# Patient Record
Sex: Male | Born: 2000 | Race: Black or African American | Hispanic: No | Marital: Single | Smoking: Never smoker
Health system: Southern US, Community
[De-identification: ages and names within clinical notes are randomized; demographics above are authoritative.]

## PROBLEM LIST (undated history)

## (undated) DIAGNOSIS — J45909 Unspecified asthma, uncomplicated: Secondary | ICD-10-CM

## (undated) DIAGNOSIS — E785 Hyperlipidemia, unspecified: Secondary | ICD-10-CM

## (undated) DIAGNOSIS — E669 Obesity, unspecified: Secondary | ICD-10-CM

## (undated) HISTORY — DX: Unspecified asthma, uncomplicated: J45.909

## (undated) HISTORY — PX: TONSILLECTOMY AND ADENOIDECTOMY: SUR1326

## (undated) HISTORY — DX: Obesity, unspecified: E66.9

## (undated) HISTORY — DX: Hyperlipidemia, unspecified: E78.5

---
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
## 2021-02-17 ENCOUNTER — Ambulatory Visit (INDEPENDENT_AMBULATORY_CARE_PROVIDER_SITE_OTHER): Payer: No Typology Code available for payment source | Admitting: Pediatric Endocrinology

## 2021-05-06 ENCOUNTER — Ambulatory Visit (INDEPENDENT_AMBULATORY_CARE_PROVIDER_SITE_OTHER): Payer: PRIVATE HEALTH INSURANCE | Admitting: Pediatric Endocrinology

## 2021-05-06 ENCOUNTER — Other Ambulatory Visit: Payer: Self-pay

## 2021-05-06 ENCOUNTER — Encounter (INDEPENDENT_AMBULATORY_CARE_PROVIDER_SITE_OTHER): Payer: Self-pay | Admitting: Pediatric Endocrinology

## 2021-05-06 VITALS — BP 130/90 | HR 84 | Wt 372.0 lb

## 2021-05-06 DIAGNOSIS — E8881 Metabolic syndrome: Secondary | ICD-10-CM

## 2021-05-06 DIAGNOSIS — L83 Acanthosis nigricans: Secondary | ICD-10-CM

## 2021-05-06 DIAGNOSIS — E88819 Insulin resistance, unspecified: Secondary | ICD-10-CM | POA: Insufficient documentation

## 2021-05-06 DIAGNOSIS — I10 Essential (primary) hypertension: Secondary | ICD-10-CM

## 2021-05-06 DIAGNOSIS — Z68.41 Body mass index (BMI) pediatric, greater than or equal to 95th percentile for age: Secondary | ICD-10-CM

## 2021-05-06 MED ORDER — LISINOPRIL 10 MG PO TABS: 10.0000 mg | ORAL_TABLET | Freq: Every day | ORAL | 3 refills | Status: AC

## 2021-05-06 MED ORDER — WEGOVY 1 MG/0.5ML ~~LOC~~ SOAJ: 1.0000 mg | SUBCUTANEOUS | 5 refills | Status: DC

## 2021-05-06 NOTE — Progress Notes (Signed)
Subjective:  Subjective  Patient Name: Jeffrey Beard Date of Birth: [birth year]  MRN: [id number]  Jeffrey Beard  Presents to clinic today for follow up evaluation and management of his morbid obesity, acanthosis, elevated hemoglobin a1c, and elevated LDL  HISTORY OF PRESENT ILLNESS:   Jeffrey Beard is a 21 y.o. AA male   Jeffrey Beard was accompanied by his mother   1. Jeffrey Beard was seen by his PCP in January 2020 for his 18 year WCC. At that visit they discussed ongoing concerns and obtained routine labs. His labs showed that his hemoglobin A1C was elevated to 5.8%. He was also noted to have LDL of 155 mg/dL and a TC of 250 mg/dL. He was referred to endocrine for further evaluation and management.   Of note- Jeffrey Beard was previously seen in 2013 in pediatric endocrine clinic and was subsequently lost to follow up.   2. Jeffrey Beard was last seen in pediatric endocrine clinic on 11/04/20  He is working at [employer]. He is on his feet a lot. His has noticed that his feet hurt and it has been harder for him to exercise. He has a hard time walking to the gym to lift weights.   He has decreased several pant sizes. His 4X shirts are starting to be baggy on him.   He has continued on Ozempic 1 mg. Mom says that he needs a new authorization and since he does not have diabetes their insurance wants him on Wegovi instead.   He is having a harder time eating on a schedule. Mom does not feel that he is eating enough.  _______________________ (Skipped in clinic on 8/24) He was able to do 200 Jeffrey Beard today in clinic.   100 -> 150 -> 200   He still has not tried his 3X hoodie.    3. Pertinent Review of Systems:  Constitutional: The patient feels "oka". The patient seems healthy and active. Eyes: Vision seems to be good. There are no recognized eye problems. Wears glasses. Has "alternating XT" - needs strabismus surgery but worried about weight and anaesthesia.  Neck: The patient has no complaints of anterior neck  swelling, soreness, tenderness, pressure, discomfort, or difficulty swallowing.   Heart: Heart rate increases with exercise or other physical activity. The patient has no complaints of palpitations, irregular heart beats, chest pain, or chest pressure.   Lungs: Asthma- no recent attacks. Snoring has improved.  Gastrointestinal: Bowel movents seem normal. The patient has no complaints of acid reflux, upset stomach, stomach aches or pains, diarrhea, or constipation. Legs: Muscle mass and strength seem normal. There are no complaints of numbness, tingling, burning, or pain. No edema is noted.  Feet: There are no obvious foot problems. There are no complaints of numbness, tingling, burning, or pain. No edema is noted. Neurologic: There are no recognized problems with muscle movement and strength, sensation, or coordination. GYN/GU: no issues. Denies polyuria.   PAST MEDICAL, FAMILY, AND SOCIAL HISTORY  Past Medical History:  Diagnosis Date   Asthma    Hyperlipidemia    Obesity     Family History  Problem Relation Age of Onset   Obesity Mother    Obesity Father    Hyperlipidemia Maternal Grandmother    Diabetes Paternal Grandmother      Current Outpatient Medications:    lisinopril (ZESTRIL) 10 MG tablet, Take 1 tablet (10 mg total) by mouth daily., Disp: 90 tablet, Rfl: 3   Semaglutide-Weight Management (WEGOVY) 1 MG/0.5ML SOAJ, Inject 1 mg into the skin once  a week., Disp: 2 mL, Rfl: 5   fluticasone (FLONASE) 50 MCG/ACT nasal spray, Place into the nose. (Patient not taking: Reported on 04/29/2020), Disp: , Rfl:    lansoprazole (PREVACID) 15 MG capsule, Take 1 capsule (15 mg total) by mouth daily. (Patient not taking: Reported on 05/06/2021), Disp: 30 capsule, Rfl: 6   montelukast (SINGULAIR) 5 MG chewable tablet, Chew 5 mg by mouth at bedtime. (Patient not taking: Reported on 04/29/2020), Disp: , Rfl:   Allergies as of 05/06/2021   (No Known Allergies)     reports that he has never  smoked. He has been exposed to tobacco smoke. He has never used smokeless tobacco. He reports that he does not drink alcohol and does not use drugs. Pediatric History  Patient Parents   Merant,Lorraine (Mother)   Other Topics Concern   Not on file  Social History Narrative   Currently looking for a job.     1. 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  2. Activities: gym 3. Primary Care Provider: Diamantina Monkseid, Maria, MD  ROS: There are no other significant problems involving Jeffrey Beard's other body systems.    Objective:  Objective  Vital Signs:   BP 130/90 (BP Location: Right Arm, Patient Position: Sitting, Cuff Size: Large)    Pulse 84    Wt (!) 372 lb (168.7 kg)    BMI 57.10 kg/m       Ht Readings from Last 3 Encounters:  04/03/19 5' 7.68" (1.719 m) (28 %, Z= -0.59)*  03/29/19 5\' 8"  (1.727 m) (32 %, Z= -0.48)*  05/16/18 5' 7.72" (1.72 m) (32 %, Z= -0.47)*   * Growth percentiles are based on CDC (Boys, 2-20 Years) data.   Wt Readings from Last 3 Encounters:  05/06/21 (!) 372 lb (168.7 kg)  11/04/20 (!) 386 lb 6.4 oz (175.3 kg) (>99 %, Z= 3.81)*  08/04/20 (!) 374 lb 6.4 oz (169.8 kg) (>99 %, Z= 3.70)*   * Growth percentiles are based on CDC (Boys, 2-20 Years) data.   HC Readings from Last 3 Encounters:  [name]   Body surface area is 2.84 meters squared. Facility age limit for growth percentiles is 20 years. Facility age limit for growth percentiles is 20 years.    PHYSICAL EXAM:   Constitutional: The patient appears healthy and well nourished. The patient's height and weight are advanced for age. He has lost 14 pounds in the past 6 months.   Head: The head is normocephalic. Face: The face appears normal. There are no obvious dysmorphic features. Eyes: The eyes appear to be normally formed and spaced. Gaze is conjugate. There is no obvious arcus or proptosis. Moisture  appears normal. Ears: The ears are normally placed and appear externally normal. Mouth: The oropharynx and tongue appear normal. Dentition appears to be normal for age. Oral moisture is normal. Neck: The neck appears to be visibly normal. The thyroid gland is 15 grams in size. The consistency of the thyroid gland is normal. The thyroid gland is not tender to palpation. 1-2+ acanthosis Lungs: The lungs are clear to auscultation. Air movement is good. Heart: Heart rate and rhythm are regular. Heart sounds S1 and S2 are normal. I did not appreciate any pathologic cardiac murmurs. Abdomen: The abdomen appears to be obese in size for the patient's age. Bowel sounds are normal. There is no obvious hepatomegaly, splenomegaly, or other mass effect. + truncal  obesity Arms: Muscle size and bulk are normal for age. + axillary acanthosis Hands: There is no obvious tremor. Phalangeal and metacarpophalangeal joints are normal. Palmar muscles are normal for age. Palmar skin is normal. Palmar moisture is also normal. Legs: Muscles appear normal for age. No edema is present. Feet: Feet are normally formed. Dorsalis pedal pulses are normal. Neurologic: Strength is normal for age in both the upper and lower extremities. Muscle tone is normal. Sensation to touch is normal in both the legs and feet.    LAB DATA:    Lab Results  Component Value Date   HGBA1C 5.6 11/04/2020   HGBA1C 5.3 08/04/2020   HGBA1C 5.7 (A) 04/29/2020   HGBA1C 5.7 (A) 09/03/2019   HGBA1C 5.5 04/03/2019   HGBA1C 5.4 11/01/2011   Results for ANTWANN, PREZIOSI (MRN [id number]) as of 11/04/2020 14:31  Ref. Range 09/03/2019 15:37 08/04/2020 09:22  Total CHOL/HDL Ratio Latest Ref Range: <5.0 (calc) 6.7 (H) 5.7 (H)  Cholesterol Latest Ref Range: <170 mg/dL 127 (H) 517 (H)  HDL Cholesterol Latest Ref Range: >45 mg/dL 37 (L) 35 (L)  LDL Cholesterol (Calc) Latest Ref Range: <110 mg/dL (calc) 001 (H) 749 (H)  Non-HDL Cholesterol (Calc) Latest Ref  Range: <120 mg/dL (calc) 449 (H) 675 (H)  Triglycerides Latest Ref Range: <90 mg/dL 93 (H) 91 (H)    Lab Results  Component Value Date   HGBA1C 5.6 11/04/2020   HGBA1C 5.3 08/04/2020   HGBA1C 5.7 (A) 04/29/2020   HGBA1C 5.7 (A) 09/03/2019     No results found for this or any previous visit (from the past 672 hour(s)).      Assessment and Plan:  Assessment  ASSESSMENT: Abrahan is a 21 y.o. AA male referred for morbid obesity and prediabetes/insulin resistance   Insulin resistance/acanthosis/prediabetes/elevated triglycerides/obesity BMI>40 - He is currently on Ozempic 1 mg weekly - Per mom, insurance has required change to [facility]- new Rx to pharmacy - Has had good weight loss since last visit - Acanthosis is improving nicely- less thick and less dark - Has not tolerated Metformin in the past due to GI distress - Discussed water aerobics as good option due to foot/knee pain  Elevated BP - BP still modestly elevated  - Levels have been in the Stage 1 range for the past year - Will start Lisinopril at 10 mg daily  Hyperlipidemia with elevated triglycerides/LDL - Has attempted Pravachol twice now - Patient reports elevation in heart rate after starting medication - This side effect has not been previously reported for Pravachol - Will repeat lipids at next visit.    PLAN:    1. Diagnostic: None today 2. Therapeutic:  Meds ordered this encounter  Medications   Semaglutide-Weight Management (WEGOVY) 1 MG/0.5ML SOAJ    Sig: Inject 1 mg into the skin once a week.    Dispense:  2 mL    Refill:  5   lisinopril (ZESTRIL) 10 MG tablet    Sig: Take 1 tablet (10 mg total) by mouth daily.    Dispense:  90 tablet    Refill:  3    3. Patient education: Lengthy discussion of the above.  4. Follow-up: Return in about 3 months (around 08/03/2021).      Dessa Phi, MD  >30 minutes spent today reviewing the medical chart, counseling the patient/family, and documenting  today's encounter.      Patient referred by Diamantina Monks, MD for obesity, prediabetes, elevated TG  Copy of this note sent to [facility],  Byrd Hesselbach, MD

## 2021-05-07 ENCOUNTER — Telehealth (INDEPENDENT_AMBULATORY_CARE_PROVIDER_SITE_OTHER): Payer: Self-pay

## 2021-05-07 NOTE — Telephone Encounter (Signed)
Per fax received by [employer], initiated PA for [employer] in covermymeds:

## 2021-05-07 NOTE — Telephone Encounter (Signed)
APPROVED

## 2021-08-03 ENCOUNTER — Ambulatory Visit (INDEPENDENT_AMBULATORY_CARE_PROVIDER_SITE_OTHER): Payer: PRIVATE HEALTH INSURANCE | Admitting: Pediatric Endocrinology

## 2021-08-05 ENCOUNTER — Ambulatory Visit (INDEPENDENT_AMBULATORY_CARE_PROVIDER_SITE_OTHER): Payer: PRIVATE HEALTH INSURANCE | Admitting: Pediatric Endocrinology

## 2021-08-05 ENCOUNTER — Encounter (INDEPENDENT_AMBULATORY_CARE_PROVIDER_SITE_OTHER): Payer: Self-pay | Admitting: Pediatric Endocrinology

## 2021-08-05 VITALS — BP 122/70 | HR 84 | Ht 68.35 in | Wt 386.0 lb

## 2021-08-05 DIAGNOSIS — E8881 Metabolic syndrome: Secondary | ICD-10-CM | POA: Diagnosis not present

## 2021-08-05 DIAGNOSIS — E782 Mixed hyperlipidemia: Secondary | ICD-10-CM | POA: Diagnosis not present

## 2021-08-05 MED ORDER — WEGOVY 1.7 MG/0.75ML ~~LOC~~ SOAJ: 1.7000 mg | SUBCUTANEOUS | 3 refills | Status: AC

## 2021-08-05 NOTE — Progress Notes (Signed)
Subjective:  Subjective  Patient Name: Jeffrey Beard Date of Birth: 2000/07/31  MRN: [id number]  Jeffrey Beard  Presents to clinic today for follow up evaluation and management of his morbid obesity, acanthosis, elevated hemoglobin a1c, and elevated LDL  HISTORY OF PRESENT ILLNESS:   Jeffrey Beard is a 21 y.o. [place] male   Kelsie was accompanied by his mother   1. Quinterious was seen by his PCP in January 2020 for his 18 year [place]. At that visit they discussed ongoing concerns and obtained routine labs. His labs showed that his hemoglobin A1C was elevated to 5.8%. He was also noted to have LDL of 155 mg/dL and a TC of 210 mg/dL. He was referred to endocrine for further evaluation and management.   Of note- Jeffrey Beard was previously seen in 2013 in pediatric endocrine clinic and was subsequently lost to follow up.   2. Jeffrey Beard was last seen in pediatric endocrine clinic on 05/06/21.  At his last visit we had to change his medication from [place] to [facility]. He is using an auto-injector for the 1 mg dose. He and mom both feel that he now needs more. He did miss a couple doses in April. He is sometimes having headaches day 2 after taking the [facility]. Mom has the same.   He has continued working at [employer] in [employer]. 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 He is going to get more perks including insurance.   He has continued to have foot pain. He got some steel toe, slip resistant, shoes, but he feels that they still slip. He also hurt his back trying to move a refrigerator by himself. He says that it was annoying him where it was.   He has found that he has more pain when he is staying still too long. He likes to walk to loosen things up. He is still going to the gym. He likes to use the dumbells.   He is now able to fit the 3X shirt that he has been trying to be back into. All his pants are too big. He has gone down 6 waist sizes for pants.   He is going to [employer] in [place] in July and is hoping to be  able to fit even smaller sizes by then.   Mom is concerned that he is not eating at work. He is usually working by himself and doesn't really get a break. Mom tries to get him to eat before work and then he has dinner after work.   He was able to do 400 zumba jacks in clinic today. He had not done them the past few visits. He previously started at 100 zumba jacks and had done 200 at his last attempt.   Mom did 100 ZJ in clinic today.  _______________________ (Skipped in clinic on 8/24) He was able to do [place] today in clinic.   100 -> 150 -> 200   He still has not tried his 3X hoodie.    3. Pertinent Review of Systems:  Constitutional: The patient feels "good". The patient seems healthy and active. Eyes: Vision seems to be good. There are no recognized eye problems. Wears glasses. Has "alternating XT" - needs strabismus surgery but worried about weight and anaesthesia.  Neck: The patient has no complaints of anterior neck swelling, soreness, tenderness, pressure, discomfort, or difficulty swallowing.   Heart: Heart rate increases with exercise or other physical activity. The patient has no complaints of palpitations, irregular heart beats,  chest pain, or chest pressure.   Lungs: Asthma- no recent attacks. Snoring has improved.  Gastrointestinal: Bowel movents seem normal. The patient has no complaints of acid reflux, upset stomach, stomach aches or pains, diarrhea, or constipation. Legs: Muscle mass and strength seem normal. There are no complaints of numbness, tingling, burning, or pain. No edema is noted.  Feet: There are no obvious foot problems. There are no complaints of numbness, tingling, burning, or pain. No edema is noted. Neurologic: There are no recognized problems with muscle movement and strength, sensation, or coordination. GYN/GU: no issues. Denies polyuria.   PAST MEDICAL, FAMILY, AND SOCIAL HISTORY  Past Medical History:  Diagnosis Date   Asthma     Hyperlipidemia    Obesity     Family History  Problem Relation Age of Onset   Obesity Mother    Obesity Father    Hyperlipidemia Maternal Grandmother    Diabetes Paternal Grandmother      Current Outpatient Medications:    lisinopril (ZESTRIL) 10 MG tablet, Take 1 tablet (10 mg total) by mouth daily., Disp: 90 tablet, Rfl: 3   Semaglutide-Weight Management (WEGOVY) 1.7 MG/0.75ML SOAJ, Inject 1.7 mg into the skin once a week., Disp: 3 mL, Rfl: 3   fluticasone (FLONASE) 50 MCG/ACT nasal spray, Place into the nose. (Patient not taking: Reported on 04/29/2020), Disp: , Rfl:    lansoprazole (PREVACID) 15 MG capsule, Take 1 capsule (15 mg total) by mouth daily. (Patient not taking: Reported on 05/06/2021), Disp: 30 capsule, Rfl: 6   methocarbamol (ROBAXIN) 750 MG tablet, Take by mouth. (Patient not taking: Reported on 08/05/2021), Disp: , Rfl:    montelukast (SINGULAIR) 5 MG chewable tablet, Chew 5 mg by mouth at bedtime. (Patient not taking: Reported on 04/29/2020), Disp: , Rfl:   Allergies as of 08/05/2021   (No Known Allergies)     reports that he has never smoked. He has been exposed to tobacco smoke. He has never used smokeless tobacco. He reports that he does not drink alcohol and does not use drugs. Pediatric History  Patient Parents   Merant,Lorraine (Mother)   Other Topics Concern   Not on file  Social History Narrative   Currently looking for a job.     1. 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 Will be doing his associates at [employer]. He signed up and is hoping to start classes soon.  2. Activities: gym 3. Primary Care Provider: Dion Body, MD  ROS: There are no other significant problems involving Jeffrey Beard's other body systems.    Objective:  Objective  Vital Signs:   BP 122/70 (BP Location: Right Arm, Patient Position: Sitting, Cuff Size: Large)   Pulse 84   Ht 5' 8.35" (1.736 m)   Wt (!) 386 lb (175.1 kg)   BMI  58.10 kg/m     Ht Readings from Last 3 Encounters:  08/05/21 5' 8.35" (1.736 m)  04/03/19 5' 7.68" (1.719 m) (28 %, Z= -0.59)*  03/29/19 5\' 8"  (1.727 m) (32 %, Z= -0.48)*   * Growth percentiles are based on CDC (Boys, 2-20 Years) data.   Wt Readings from Last 3 Encounters:  08/05/21 (!) 386 lb (175.1 kg)  05/06/21 (!) 372 lb (168.7 kg)  11/04/20 (!) 386 lb 6.4 oz (175.3 kg) (>99 %, Z= 3.81)*   * Growth percentiles are based on CDC (Boys, 2-20 Years) data.   HC Readings from Last 3 Encounters:  No data found for [facility]  Body surface area is 2.91 meters squared. Facility age limit for growth percentiles is 20 years. Facility age limit for growth percentiles is 20 years.    PHYSICAL EXAM:   Constitutional: The patient appears healthy and well nourished. The patient's height and weight are advanced for age. He has re-gained 14 pounds in the past 3 months.  It seems to be mostly muscle Head: The head is normocephalic. Face: The face appears normal. There are no obvious dysmorphic features. Eyes: The eyes appear to be normally formed and spaced. Gaze is conjugate. There is no obvious arcus or proptosis. Moisture appears normal. Ears: The ears are normally placed and appear externally normal. Mouth: The oropharynx and tongue appear normal. Dentition appears to be normal for age. Oral moisture is normal. Neck: The neck appears to be visibly normal. The thyroid gland is 15 grams in size. The consistency of the thyroid gland is normal. The thyroid gland is not tender to palpation. 1-2+ acanthosis. Some clearing Lungs: The lungs are clear to auscultation. Air movement is good. Heart: Heart rate and rhythm are regular. Heart sounds S1 and S2 are normal. I did not appreciate any pathologic cardiac murmurs. Abdomen: The abdomen appears to be obese in size for the patient's age. Bowel sounds are normal. There is no obvious hepatomegaly, splenomegaly, or other mass effect. + truncal obesity Arms:  Muscle size and bulk are normal for age. + axillary acanthosis Hands: There is no obvious tremor. Phalangeal and metacarpophalangeal joints are normal. Palmar muscles are normal for age. Palmar skin is normal. Palmar moisture is also normal. Legs: Muscles appear normal for age. No edema is present. Feet: Feet are normally formed. Dorsalis pedal pulses are normal. Neurologic: Strength is normal for age in both the upper and lower extremities. Muscle tone is normal. Sensation to touch is normal in both the legs and feet.    LAB DATA:    Lab Results  Component Value Date   HGBA1C 5.6 11/04/2020   HGBA1C 5.3 08/04/2020   HGBA1C 5.7 (A) 04/29/2020   HGBA1C 5.7 (A) 09/03/2019   HGBA1C 5.5 04/03/2019   HGBA1C 5.4 11/01/2011   Results for JESIEL, LOBATO (MRN [id number]) as of 11/04/2020 14:31  Ref. Range 09/03/2019 15:37 08/04/2020 09:22  Total CHOL/HDL Ratio Latest Ref Range: <5.0 (calc) 6.7 (H) 5.7 (H)  Cholesterol Latest Ref Range: <170 mg/dL 247 (H) 199 (H)  HDL Cholesterol Latest Ref Range: >45 mg/dL 37 (L) 35 (L)  LDL Cholesterol (Calc) Latest Ref Range: <110 mg/dL (calc) 189 (H) 144 (H)  Non-HDL Cholesterol (Calc) Latest Ref Range: <120 mg/dL (calc) 210 (H) 164 (H)  Triglycerides Latest Ref Range: <90 mg/dL 93 (H) 91 (H)    Lab Results  Component Value Date   HGBA1C 5.6 11/04/2020   HGBA1C 5.3 08/04/2020   HGBA1C 5.7 (A) 04/29/2020   HGBA1C 5.7 (A) 09/03/2019     No results found for this or any previous visit (from the past 672 hour(s)).      Assessment and Plan:  Assessment  ASSESSMENT: [place] is a 21 y.o. [place] male referred for morbid obesity and prediabetes/insulin resistance   Insulin resistance/acanthosis/prediabetes/elevated triglycerides/obesity BMI>40 - He is currently on Wegovy 1 mg weekly - Will increase to 1.7 mg weekly - Acanthosis is improving nicely- less thick and less dark - Has not tolerated Metformin in the past due to GI distress  Elevated BP - BP at  target today - Levels have been in the Stage 1 range for the past  year - He is taking Lisinopril 10 mg daily  Hyperlipidemia with elevated triglycerides/LDL - Has attempted Pravachol twice now - Patient reports elevation in heart rate after starting medication - This side effect has not been previously reported for Pravachol - Will repeat lipids today as patient is fasting.     PLAN:    1. Diagnostic: Lab Orders         Comprehensive metabolic panel         Lipid panel     2. Therapeutic:  Meds ordered this encounter  Medications   Semaglutide-Weight Management (WEGOVY) 1.7 MG/0.75ML SOAJ    Sig: Inject 1.7 mg into the skin once a week.    Dispense:  3 mL    Refill:  3    3. Patient education: Lengthy discussion of the above.  4. Follow-up: Return in about 3 months (around 11/05/2021).      Lelon Huh, MD >40 minutes spent today reviewing the medical chart, counseling the patient/family, and documenting today's encounter.       Patient referred by Dion Body, MD for obesity, prediabetes, elevated TG  Copy of this note sent to Dion Body, MD

## 2021-08-06 LAB — LIPID PANEL
Cholesterol: 192 mg/dL (ref <200)
HDL: 40 mg/dL (ref >=40)
LDL Cholesterol (Calc): 137 mg/dL (calc) — ABNORMAL HIGH
Non-HDL Cholesterol (Calc): 152 mg/dL (calc) — ABNORMAL HIGH (ref <130)
Total CHOL/HDL Ratio: 4.8 (calc) (ref <5.0)
Triglycerides: 54 mg/dL (ref <150)

## 2021-08-06 LAB — COMPREHENSIVE METABOLIC PANEL
AG Ratio: 1.5 (calc) (ref 1.0–2.5)
ALT: 49 U/L — ABNORMAL HIGH (ref 9–46)
AST: 34 U/L (ref 10–40)
Albumin: 4.1 g/dL (ref 3.6–5.1)
Alkaline phosphatase (APISO): 83 U/L (ref 36–130)
BUN: 12 mg/dL (ref 7–25)
CO2: 25 mmol/L (ref 20–32)
Calcium: 9.1 mg/dL (ref 8.6–10.3)
Chloride: 106 mmol/L (ref 98–110)
Creat: 0.86 mg/dL (ref 0.60–1.24)
Globulin: 2.8 g/dL (calc) (ref 1.9–3.7)
Glucose, Bld: 95 mg/dL (ref 65–99)
Potassium: 4.7 mmol/L (ref 3.5–5.3)
Sodium: 141 mmol/L (ref 135–146)
Total Bilirubin: 0.4 mg/dL (ref 0.2–1.2)
Total Protein: 6.9 g/dL (ref 6.1–8.1)

## 2021-08-13 ENCOUNTER — Telehealth (INDEPENDENT_AMBULATORY_CARE_PROVIDER_SITE_OTHER): Payer: Self-pay

## 2021-08-13 NOTE — Telephone Encounter (Signed)
Reviewed pt lab results with pts mom. She stated understanding and had no further questions.

## 2021-08-13 NOTE — Telephone Encounter (Addendum)
Lvm for pt to call back   ----- Message from Lorinda Creed, [place] sent at 08/11/2021  3:56 PM EDT -----  ----- Message ----- From: Lelon Huh, MD Sent: 08/10/2021   4:56 PM EDT To: Dion Body, MD, [facility] Clinical Pool  Cholesterol labs have continued to improve nicely with focus on exercise and lifestyle.   Dr. Baldo Ash

## 2021-08-13 NOTE — Telephone Encounter (Signed)
-----   Message from Fransisco Hertz, CMA sent at 08/11/2021  3:56 PM EDT -----  ----- Message ----- From: Dessa Phi, MD Sent: 08/10/2021   4:56 PM EDT To: Diamantina Monks, MD, [facility] Clinical Pool  Cholesterol labs have continued to improve nicely with focus on exercise and lifestyle.   Dr. Vanessa Coto [place]

## 2021-11-08 ENCOUNTER — Ambulatory Visit (INDEPENDENT_AMBULATORY_CARE_PROVIDER_SITE_OTHER): Payer: PRIVATE HEALTH INSURANCE | Admitting: Pediatric Endocrinology
